# Patient Record
Sex: Male | Born: 1980 | Race: Black or African American | Hispanic: No | Marital: Single | Smoking: Never smoker
Health system: Southern US, Community
[De-identification: ages and names within clinical notes are randomized; demographics above are authoritative.]

## PROBLEM LIST (undated history)

## (undated) HISTORY — PX: WISDOM TOOTH EXTRACTION: SHX21

---

## 2015-01-21 ENCOUNTER — Ambulatory Visit (INDEPENDENT_AMBULATORY_CARE_PROVIDER_SITE_OTHER): Payer: PRIVATE HEALTH INSURANCE | Admitting: Family Medicine

## 2015-01-21 VITALS — BP 128/78 | HR 86 | Temp 98.3°F | Resp 16 | Ht 69.75 in | Wt 197.0 lb

## 2015-01-21 DIAGNOSIS — R300 Dysuria: Secondary | ICD-10-CM | POA: Diagnosis not present

## 2015-01-21 LAB — POCT URINALYSIS DIPSTICK
Bilirubin, UA: NEGATIVE
Blood, UA: NEGATIVE
Glucose, UA: NEGATIVE
Ketones, UA: NEGATIVE
Leukocytes, UA: NEGATIVE
Nitrite, UA: NEGATIVE
Protein, UA: NEGATIVE
Spec Grav, UA: 1.025
Urobilinogen, UA: 0.2
pH, UA: 5.5

## 2015-01-21 LAB — POCT UA - MICROSCOPIC ONLY
Bacteria, U Microscopic: NEGATIVE
Casts, Ur, LPF, POC: NEGATIVE
Crystals, Ur, HPF, POC: NEGATIVE
Mucus, UA: NEGATIVE
RBC, urine, microscopic: NEGATIVE
WBC, Ur, HPF, POC: NEGATIVE
Yeast, UA: NEGATIVE

## 2015-01-21 MED ORDER — DOXYCYCLINE HYCLATE 100 MG PO TABS: 100.0000 mg | ORAL_TABLET | Freq: Two times a day (BID) | ORAL | Status: DC

## 2015-01-21 NOTE — Patient Instructions (Signed)
This may be a ureaplasma "infection" or just a simple irritation.  Lab tests are pending.

## 2015-01-21 NOTE — Progress Notes (Addendum)
Subjective:  This chart was scribed for Randy SidleKurt Lauenstein, MD by Broadus Johnawaa Al Rifaie, Medical Scribe. This patient was seen in Room 10  and the patient's care was started at 10:19 AM.   Patient ID: Randy Curry, male    DOB: 04/21/1981, 34 y.o.   MRN: [id number]  HPI HPI Comments: Randy NatalMichael S Curry is a 34 y.o. male who presents to [facility] complaining of irritation after urination, gradual onset about 4 days ago. Pt notes that he has been in the pool recently, however he does not recall any other major events. Pt denies any abnormal discharges, nausea, vomiting, fever, or abdominal pain. Pt reports no new sexual partners other than his current partner.   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    Review of Systems  Constitutional: Negative for fever.  Gastrointestinal: Negative for nausea, vomiting and abdominal pain.  Genitourinary: Positive for difficulty urinating (irritation after urination ). Negative for discharge.      Objective:   Physical Exam  Constitutional: He is oriented to person, place, and time. He appears well-developed and well-nourished. No distress.  HENT:  Head: Normocephalic and atraumatic.  Eyes: EOM are normal. Pupils are equal, round, and reactive to light.  Neck: Neck supple.  Cardiovascular: Normal rate.   Pulmonary/Chest: Effort normal.  Abdominal: Hernia confirmed negative in the right inguinal area and confirmed negative in the left inguinal area.  Genitourinary: Circumcised.  Small Skin tag on the side of penis about 1 mm  1 cm cyst-like structure above the testicle. Both testacies are normal. No unusual skin lesions.     Neurological: He is alert and oriented to person, place, and time. No cranial nerve deficit.  Skin: Skin is warm and dry.  Psychiatric: He has a normal mood and affect. His behavior is normal.  Nursing note and vitals reviewed.  Results for orders placed or performed in visit on 01/21/15  POCT urinalysis  dipstick  Result Value Ref Range   Color, UA yellow    Clarity, UA clear    Glucose, UA neg    Bilirubin, UA neg    Ketones, UA neg    Spec Grav, UA 1.025    Blood, UA neg    pH, UA 5.5    Protein, UA neg    Urobilinogen, UA 0.2    Nitrite, UA neg    Leukocytes, UA Negative Negative  POCT UA - Microscopic Only  Result Value Ref Range   WBC, Ur, HPF, POC neg    RBC, urine, microscopic neg    Bacteria, U Microscopic neg    Mucus, UA neg    Epithelial cells, urine per micros 0-1    Crystals, Ur, HPF, POC neg    Casts, Ur, LPF, POC neg    Yeast, UA neg        Assessment & Plan:   This chart was scribed in my presence and reviewed by me personally.    ICD-9-CM ICD-10-CM   1. Dysuria 788.1 R30.0 POCT urinalysis dipstick     POCT UA - Microscopic Only     Urine culture     GC/Chlamydia Probe Amp     doxycycline (VIBRA-TABS) 100 MG tablet   This seems to be a very minor problem and there is no obvious cause detected. We discussed the fact that it could be simple irritation after intercourse, irritation after bathing last week, Ureaplasma. Sometimes is best just to wait and see if it goes away  on its own, but I wrote a prescription for doxycycline in case it persists. Lab tests are pending.  Signed, Randy Sidle, MD

## 2015-01-22 LAB — GC/CHLAMYDIA PROBE AMP
CT Probe RNA: NEGATIVE
GC Probe RNA: NEGATIVE

## 2015-01-22 LAB — URINE CULTURE
Colony Count: NO GROWTH
Organism ID, Bacteria: NO GROWTH

## 2015-05-09 ENCOUNTER — Ambulatory Visit
Admission: RE | Admit: 2015-05-09 | Discharge: 2015-05-09 | Disposition: A | Discharge status: Home / Self Care | Payer: Self-pay | Source: Ambulatory Visit | Attending: Emergency Medicine | Admitting: Emergency Medicine

## 2015-05-09 ENCOUNTER — Ambulatory Visit
Admit: 2015-05-09 | Discharge: 2015-05-09 | Disposition: A | Discharge status: Home / Self Care | Payer: Self-pay | Attending: Emergency Medicine | Admitting: Emergency Medicine

## 2015-05-09 DIAGNOSIS — M79671 Pain in right foot: Secondary | ICD-10-CM | POA: Insufficient documentation

## 2016-12-18 IMAGING — CR DG FOOT COMPLETE 3+V*R*
1 series · 3 of 3 positions shown · non-contrast
Comparison: None.

CLINICAL DATA: Nontraumatic plantar pain

EXAM:
RIGHT FOOT COMPLETE - 3+ VIEW

[Series 1: dg foot complete right · 0.14mm/px · 3 of 3 slices shown]
[im 1/3]
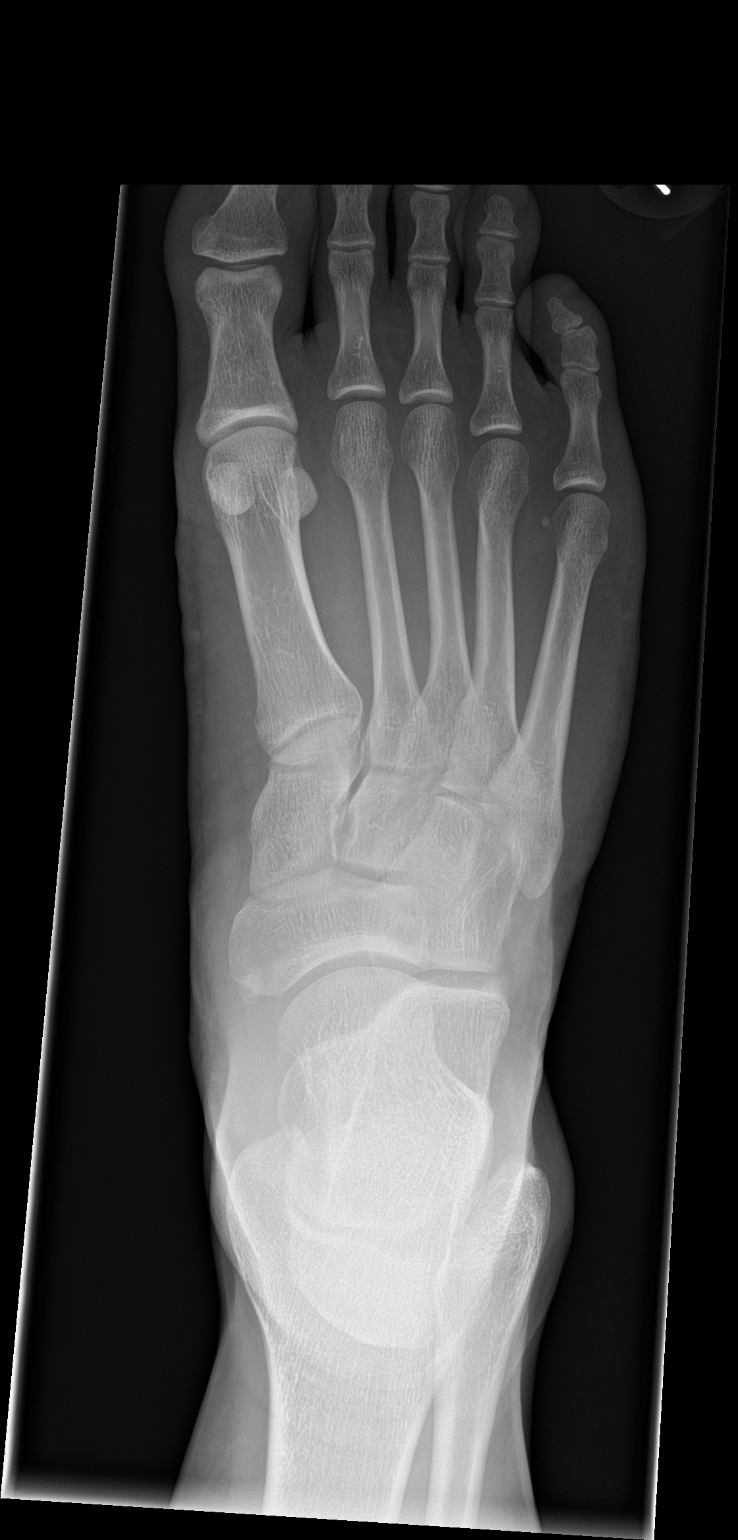
[im 2/3]
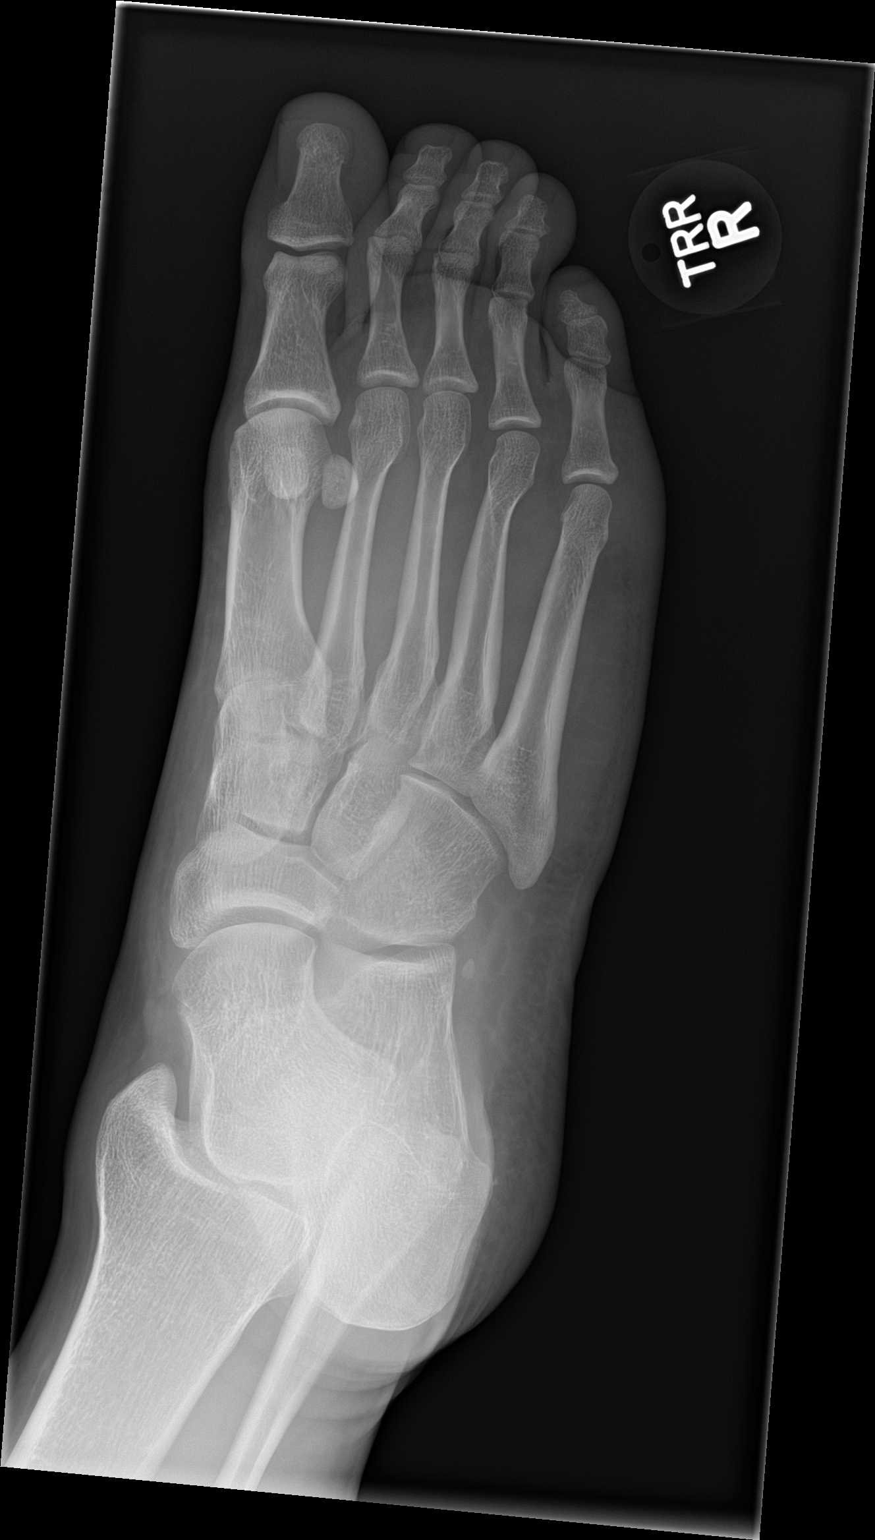
[im 3/3]
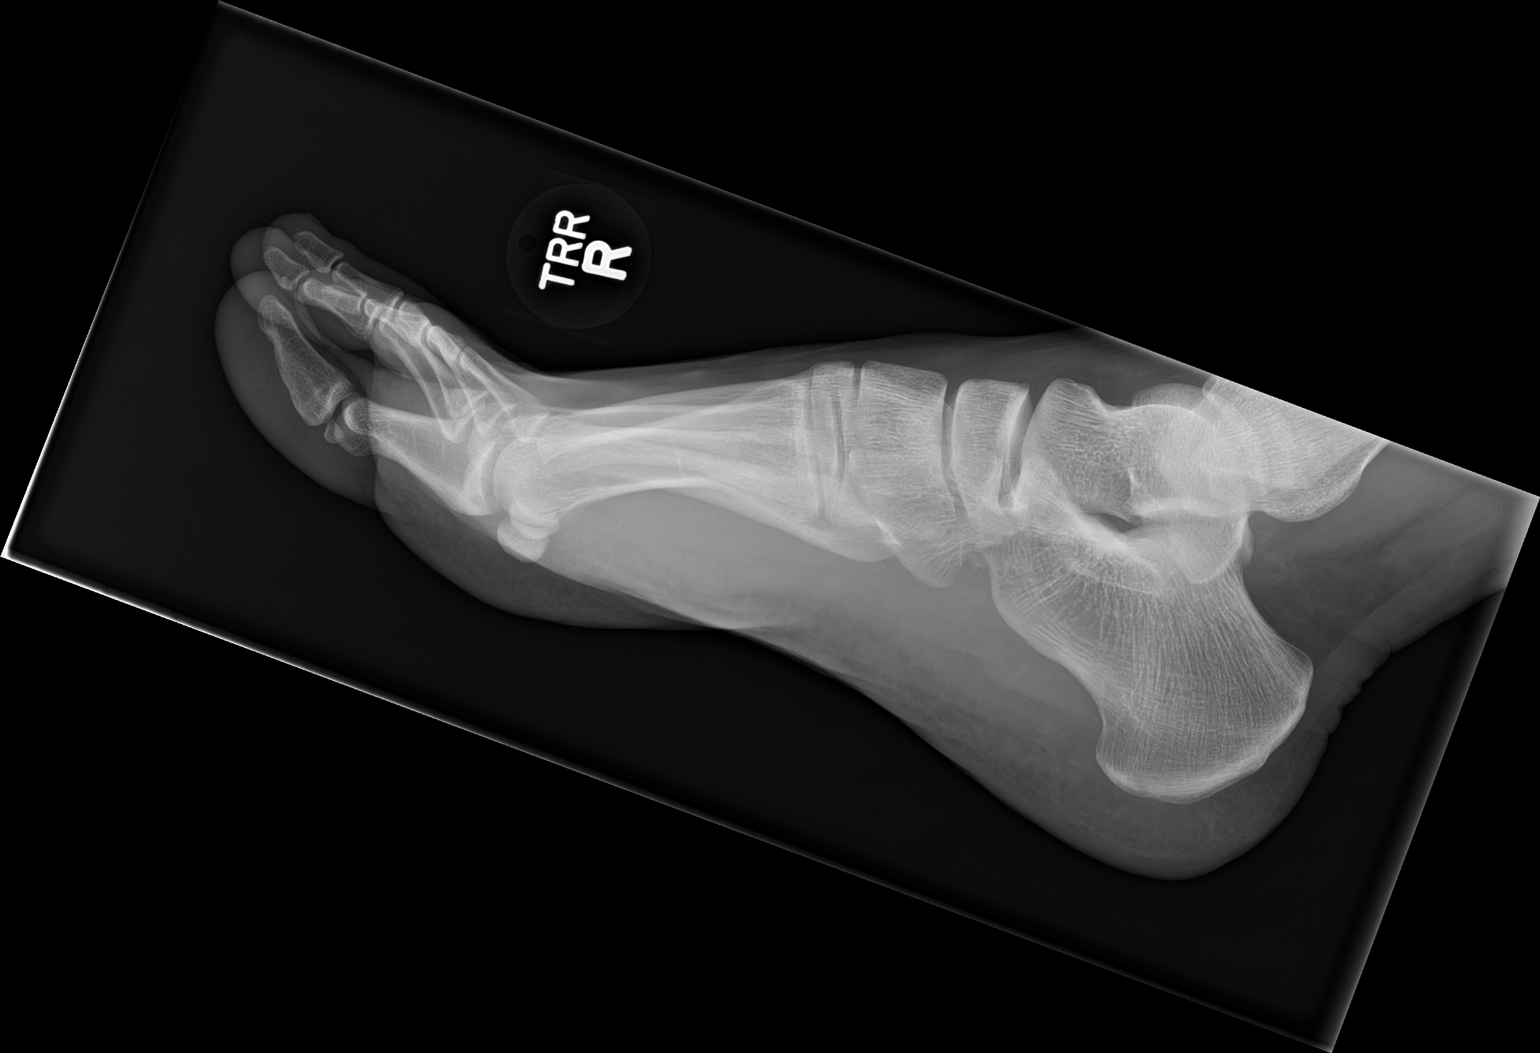

[3 of 3 positions shown; findings below may reference images not displayed]

FINDINGS: There is no evidence of fracture or dislocation. There is no
evidence of arthropathy or other focal bone abnormality. Soft
tissues are unremarkable.
IMPRESSION: Negative.

## 2017-11-01 ENCOUNTER — Ambulatory Visit (INDEPENDENT_AMBULATORY_CARE_PROVIDER_SITE_OTHER): Payer: 59

## 2017-11-01 ENCOUNTER — Encounter (HOSPITAL_COMMUNITY): Payer: Self-pay | Admitting: Emergency Medicine

## 2017-11-01 ENCOUNTER — Ambulatory Visit (HOSPITAL_COMMUNITY)
Admission: EM | Admit: 2017-11-01 | Discharge: 2017-11-01 | Disposition: A | Discharge status: Home / Self Care | Payer: 59 | Attending: Family Medicine | Admitting: Family Medicine

## 2017-11-01 DIAGNOSIS — M79671 Pain in right foot: Secondary | ICD-10-CM

## 2017-11-01 NOTE — Discharge Instructions (Addendum)
Shoe support Ice Antiinflammatory meds Reduce activity and gradually build back up See foot and ankle if fails to improve

## 2017-11-01 NOTE — ED Provider Notes (Signed)
[facility]    CSN: [id number] Arrival date & time: 11/01/17  1206     History   Chief Complaint Chief Complaint  Patient presents with  . Foot Pain    HPI Randy Curry is a 37 y.o. male.   HPI Patient has a right foot injury.  No trauma.  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  He runs on a treadmill and on incline.  He has had a gradual onset of foot pain over the last month.  It is not getting better.  It is swollen.  He is never had similar problems before.  He did twist his foot some years ago, but it recovered completely with no problems. History reviewed. No pertinent past medical history.  There are no active problems to display for this patient.   Past Surgical History:  Procedure Laterality Date  . WISDOM TOOTH EXTRACTION         Home Medications    Prior to Admission medications   Medication Sig Start Date End Date Taking? Authorizing Provider  doxycycline (VIBRA-TABS) 100 MG tablet Take 1 tablet (100 mg total) by mouth 2 (two) times daily. Patient not taking: Reported on 11/01/2017 01/21/15   [name], MD    Family History Family History  Problem Relation Age of Onset  . Hypertension Father     Social History Social History   Tobacco Use  . Smoking status: Never Smoker  Substance Use Topics  . Alcohol use: Not on file  . Drug use: Not on file     Allergies   Penicillins and Sulfa antibiotics   Review of Systems Review of Systems  Musculoskeletal: Positive for arthralgias and gait problem.  All other systems reviewed and are negative.  Patient states otherwise is healthy and well, on no medications.  No history of gout  Physical Exam Triage Vital Signs ED Triage Vitals [11/01/17 1219]  Enc Vitals Group     BP 123/75     Pulse Rate 66     Resp 18     Temp 98.1 F (36.7 C)     Temp src      SpO2 98 %     Weight      Height      Head  Circumference      Peak Flow      Pain Score      Pain Loc      Pain Edu?      Excl. in GC?    No data found.  Updated Vital Signs BP 123/75   Pulse 66   Temp 98.1 F (36.7 C)   Resp 18   SpO2 98%       Physical Exam  Constitutional: He appears well-developed and well-nourished. No distress.  HENT:  Head: Normocephalic and atraumatic.  Mouth/Throat: Oropharynx is clear and moist.  Musculoskeletal:  Exam limited to feet.  Left foot is normal.  Right foot appears normal, moderate soft tissue swelling between the first and second metatarsals distally.  Tenderness palpation in this area.  No tenderness over the metatarsal bones.  No tenderness over the MTP joints or pain with movement.  Bottom of the foot appears normal.  No erythema or warmth.  X-rays are negative     UC Treatments / Results  Labs (all labs ordered are listed, but only abnormal results are displayed) Labs Reviewed -  No data to display  EKG None  Radiology Dg Foot Complete Right  Result Date: 11/01/2017 CLINICAL DATA:  Right foot pain.  No known injury. EXAM: RIGHT FOOT COMPLETE - 3+ VIEW COMPARISON:  05/09/2015. FINDINGS: No acute bony or joint abnormality. No evidence of fracture dislocation. No radiopaque soft tissue foreign bodies noted. IMPRESSION: No acute or focal abnormality. Electronically Signed   By: Maisie Fus  Register   On: 11/01/2017 12:33    Procedures Procedures (including critical care time)  Medications Ordered in UC Medications - No data to display  Initial Impression / Assessment and Plan / UC Course  I have reviewed the triage vital signs and the nursing notes.  Pertinent labs & imaging results that were available during my care of the patient were reviewed by me and considered in my medical decision making (see chart for details).     Foot pain, overuse from athletics.  Soft tissue injury.  No evidence of fracture. Final Clinical Impressions(s) / UC Diagnoses   Final  diagnoses:  Foot pain, right     Discharge Instructions     Shoe support Ice Antiinflammatory meds Reduce activity and gradually build back up See foot and ankle if fails to improve    ED Prescriptions    None     Controlled Substance Prescriptions [place] Controlled Substance Registry consulted? Not Applicable   Eustace Moore, MD 11/01/17 [phone]

## 2017-11-01 NOTE — ED Triage Notes (Signed)
R foot pain x4 weeks.

## 2017-11-14 ENCOUNTER — Other Ambulatory Visit: Payer: Self-pay | Admitting: Physician Assistant

## 2017-11-14 DIAGNOSIS — R197 Diarrhea, unspecified: Secondary | ICD-10-CM

## 2017-11-14 MED ORDER — CIPROFLOXACIN HCL 500 MG PO TABS: 500.0000 mg | ORAL_TABLET | Freq: Two times a day (BID) | ORAL | 0 refills | Status: DC

## 2017-11-21 DIAGNOSIS — M79671 Pain in right foot: Secondary | ICD-10-CM | POA: Diagnosis not present

## 2017-12-20 DIAGNOSIS — M79671 Pain in right foot: Secondary | ICD-10-CM | POA: Diagnosis not present

## 2018-03-20 ENCOUNTER — Ambulatory Visit
Admission: EM | Admit: 2018-03-20 | Discharge: 2018-03-20 | Disposition: A | Discharge status: Home / Self Care | Payer: 59 | Attending: Family Medicine | Admitting: Family Medicine

## 2018-03-20 DIAGNOSIS — R1013 Epigastric pain: Secondary | ICD-10-CM | POA: Diagnosis not present

## 2018-03-20 DIAGNOSIS — A084 Viral intestinal infection, unspecified: Secondary | ICD-10-CM

## 2018-03-20 LAB — COMPREHENSIVE METABOLIC PANEL
ALT: 18 U/L (ref 0–44)
AST: 27 U/L (ref 15–41)
Albumin: 4.2 g/dL (ref 3.5–5.0)
Alkaline Phosphatase: 46 U/L (ref 38–126)
Anion gap: 11 (ref 5–15)
BILIRUBIN TOTAL: 0.6 mg/dL (ref 0.3–1.2)
BUN: 10 mg/dL (ref 6–20)
CHLORIDE: 103 mmol/L (ref 98–111)
CO2: 27 mmol/L (ref 22–32)
CREATININE: 1.24 mg/dL (ref 0.61–1.24)
Calcium: 9 mg/dL (ref 8.9–10.3)
Glucose, Bld: 92 mg/dL (ref 70–99)
POTASSIUM: 3.6 mmol/L (ref 3.5–5.1)
Sodium: 141 mmol/L (ref 135–145)
TOTAL PROTEIN: 7.9 g/dL (ref 6.5–8.1)

## 2018-03-20 LAB — CBC WITH DIFFERENTIAL/PLATELET
Basophils Absolute: 0 10*3/uL (ref 0–0.1)
Basophils Relative: 0 %
EOS PCT: 0 %
Eosinophils Absolute: 0 10*3/uL (ref 0–0.7)
HEMATOCRIT: 46 % (ref 40.0–52.0)
Hemoglobin: 15.1 g/dL (ref 13.0–18.0)
LYMPHS ABS: 0.7 10*3/uL — AB (ref 1.0–3.6)
LYMPHS PCT: 15 %
MCH: 28.3 pg (ref 26.0–34.0)
MCHC: 32.8 g/dL (ref 32.0–36.0)
MCV: 86.3 fL (ref 80.0–100.0)
MONO ABS: 0.8 10*3/uL (ref 0.2–1.0)
MONOS PCT: 16 %
NEUTROS ABS: 3.3 10*3/uL (ref 1.4–6.5)
Neutrophils Relative %: 69 %
PLATELETS: 173 10*3/uL (ref 150–440)
RBC: 5.33 MIL/uL (ref 4.40–5.90)
RDW: 13.6 % (ref 11.5–14.5)
WBC: 4.9 10*3/uL (ref 3.8–10.6)

## 2018-03-20 LAB — LIPASE, BLOOD: LIPASE: 36 U/L (ref 11–51)

## 2018-03-20 MED ORDER — SODIUM CHLORIDE 0.9 % IV BOLUS
1000.0000 mL | Freq: Once | INTRAVENOUS | Status: AC
  Administered 2018-03-20: 1000 mL via INTRAVENOUS

## 2018-03-20 MED ORDER — GI COCKTAIL ~~LOC~~
30.0000 mL | Freq: Once | ORAL | Status: AC
  Administered 2018-03-20: 30 mL via ORAL

## 2018-03-20 MED ORDER — ONDANSETRON 8 MG PO TBDP
8.0000 mg | ORAL_TABLET | Freq: Once | ORAL | Status: AC
  Administered 2018-03-20: 8 mg via ORAL

## 2018-03-20 NOTE — ED Provider Notes (Signed)
[facility]    CSN: [id number] Arrival date & time: 03/20/18  1451     History   Chief Complaint Chief Complaint  Patient presents with  . Abdominal Pain    HPI Randy Curry is a 37 y.o. male.   37 yo male with a c/o epigastric abdominal pains and diarrhea for the past 4 days. States he took some Pepto-Bismol and diarrhea has improved/subsided, but epigastric abdominal pain today is worse. Has had nausea but no vomiting.   The history is provided by the patient.    History reviewed. No pertinent past medical history.  There are no active problems to display for this patient.   Past Surgical History:  Procedure Laterality Date  . WISDOM TOOTH EXTRACTION         Home Medications    Prior to Admission medications   Medication Sig Start Date End Date Taking? Authorizing Provider  ciprofloxacin (CIPRO) 500 MG tablet Take 1 tablet (500 mg total) by mouth 2 (two) times daily. 11/14/17   McVey, Madelaine BhatElizabeth Whitney, PA-C    Family History Family History  Problem Relation Age of Onset  . Hypertension Father     Social History Social History   Tobacco Use  . Smoking status: Never Smoker  . Smokeless tobacco: Never Used  Substance Use Topics  . Alcohol use: Yes    Alcohol/week: 0.0 standard drinks  . Drug use: Never     Allergies   Penicillins and Sulfa antibiotics   Review of Systems Review of Systems   Physical Exam Triage Vital Signs ED Triage Vitals  Enc Vitals Group     BP 03/20/18 1500 124/87     Pulse Rate 03/20/18 1500 94     Resp 03/20/18 1500 16     Temp 03/20/18 1500 99.9 F (37.7 C)     Temp Source 03/20/18 1500 Oral     SpO2 03/20/18 1500 100 %     Weight 03/20/18 1459 212 lb (96.2 kg)     Height 03/20/18 1459 5\' 11"  (1.803 m)     Head Circumference --      Peak Flow --      Pain Score 03/20/18 1458 6     Pain Loc --      Pain Edu? --      Excl. in GC? --    No data found.  Updated Vital Signs BP 124/87 (BP  Location: Left Arm)   Pulse 94   Temp 99.9 F (37.7 C) (Oral)   Resp 16   Ht 5\' 11"  (1.803 m)   Wt 96.2 kg   SpO2 100%   BMI 29.57 kg/m   Visual Acuity Right Eye Distance:   Left Eye Distance:   Bilateral Distance:    Right Eye Near:   Left Eye Near:    Bilateral Near:     Physical Exam  Constitutional: He is oriented to person, place, and time. He appears well-developed and well-nourished. No distress.  HENT:  Head: Normocephalic and atraumatic.  Cardiovascular: Normal rate, regular rhythm, normal heart sounds and intact distal pulses.  No murmur heard. Pulmonary/Chest: Effort normal and breath sounds normal. No respiratory distress. He has no wheezes. He has no rales.  Abdominal: Soft. Bowel sounds are normal. He exhibits no distension and no mass. There is tenderness (epigastric; moderate; no rebound or guarding). There is no rebound and no guarding.  Neurological: He is alert and oriented to person, place, and time.  Skin: No rash  noted. He is not diaphoretic.  Nursing note and vitals reviewed.    UC Treatments / Results  Labs (all labs ordered are listed, but only abnormal results are displayed) Labs Reviewed  CBC WITH DIFFERENTIAL/PLATELET - Abnormal; Notable for the following components:      Result Value   Lymphs Abs 0.7 (*)    All other components within normal limits  COMPREHENSIVE METABOLIC PANEL  LIPASE, BLOOD    EKG None  Radiology No results found.  Procedures Procedures (including critical care time)  Medications Ordered in UC Medications  sodium chloride 0.9 % bolus 1,000 mL (0 mLs Intravenous Stopped 03/20/18 1728)  ondansetron (ZOFRAN-ODT) disintegrating tablet 8 mg (8 mg Oral Given 03/20/18 1539)  gi cocktail (Maalox,Lidocaine,Donnatal) (30 mLs Oral Given 03/20/18 1614)    Initial Impression / Assessment and Plan / UC Course  I have reviewed the triage vital signs and the nursing notes.  Pertinent labs & imaging results that were  available during my care of the patient were reviewed by me and considered in my medical decision making (see chart for details).      Final Clinical Impressions(s) / UC Diagnoses   Final diagnoses:  Viral gastroenteritis   Discharge Instructions   None    ED Prescriptions    None      1. Lab results and diagnosis reviewed with patient 2. Given zofran 8mg  odt and GI cocktail with improvement of symptoms 3. Given 1L NS 3. Recommend supportive treatment with increased po fluids, otc meds prn  4. Follow-up prn if symptoms worsen or don't improve  Controlled Substance Prescriptions [place] Controlled Substance Registry consulted? Not Applicable   Payton Mccallum, MD 03/20/18 1929

## 2018-03-20 NOTE — ED Triage Notes (Addendum)
As per patient has abdominal pain right in middle area also had diarrhea and fever onset 2 days.

## 2018-04-10 ENCOUNTER — Ambulatory Visit (HOSPITAL_COMMUNITY)
Admission: EM | Admit: 2018-04-10 | Discharge: 2018-04-10 | Disposition: A | Discharge status: Home / Self Care | Payer: 59 | Attending: Family Medicine | Admitting: Family Medicine

## 2018-04-10 ENCOUNTER — Encounter (HOSPITAL_COMMUNITY): Payer: Self-pay | Admitting: Emergency Medicine

## 2018-04-10 DIAGNOSIS — R1013 Epigastric pain: Secondary | ICD-10-CM

## 2018-04-10 DIAGNOSIS — R197 Diarrhea, unspecified: Secondary | ICD-10-CM | POA: Insufficient documentation

## 2018-04-10 DIAGNOSIS — Z880 Allergy status to penicillin: Secondary | ICD-10-CM | POA: Diagnosis not present

## 2018-04-10 DIAGNOSIS — Z882 Allergy status to sulfonamides status: Secondary | ICD-10-CM | POA: Diagnosis not present

## 2018-04-10 LAB — POCT URINALYSIS DIP (DEVICE)
GLUCOSE, UA: NEGATIVE mg/dL
Hgb urine dipstick: NEGATIVE
Ketones, ur: NEGATIVE mg/dL
Leukocytes, UA: NEGATIVE
NITRITE: NEGATIVE
PH: 5.5 (ref 5.0–8.0)
PROTEIN: 30 mg/dL — AB
Specific Gravity, Urine: >=1.03 (ref 1.005–1.030)
Urobilinogen, UA: 1 mg/dL (ref 0.0–1.0)

## 2018-04-10 LAB — POCT I-STAT, CHEM 8
BUN: 11 mg/dL (ref 6–20)
Calcium, Ion: 1.12 mmol/L — ABNORMAL LOW (ref 1.15–1.40)
Chloride: 100 mmol/L (ref 98–111)
Creatinine, Ser: 1.2 mg/dL (ref 0.61–1.24)
GLUCOSE: 83 mg/dL (ref 70–99)
HEMATOCRIT: 47 % (ref 39.0–52.0)
HEMOGLOBIN: 16 g/dL (ref 13.0–17.0)
POTASSIUM: 3.9 mmol/L (ref 3.5–5.1)
Sodium: 138 mmol/L (ref 135–145)
TCO2: 29 mmol/L (ref 22–32)

## 2018-04-10 LAB — CBC WITH DIFFERENTIAL/PLATELET
Basophils Absolute: 0 10*3/uL (ref 0.0–0.1)
Basophils Relative: 0 %
Eosinophils Absolute: 0.1 10*3/uL (ref 0.0–0.7)
Eosinophils Relative: 2 %
HCT: 45.6 % (ref 39.0–52.0)
Hemoglobin: 14.5 g/dL (ref 13.0–17.0)
Lymphocytes Relative: 70 %
Lymphs Abs: 2.4 10*3/uL (ref 0.7–4.0)
MCH: 27.7 pg (ref 26.0–34.0)
MCHC: 31.8 g/dL (ref 30.0–36.0)
MCV: 87.2 fL (ref 78.0–100.0)
Monocytes Absolute: 0.5 10*3/uL (ref 0.1–1.0)
Monocytes Relative: 15 %
Neutro Abs: 0.4 10*3/uL — ABNORMAL LOW (ref 1.7–7.7)
Neutrophils Relative %: 13 %
Platelets: 203 10*3/uL (ref 150–400)
RBC: 5.23 MIL/uL (ref 4.22–5.81)
RDW: 12.6 % (ref 11.5–15.5)
WBC: 3.4 10*3/uL — ABNORMAL LOW (ref 4.0–10.5)

## 2018-04-10 NOTE — ED Notes (Signed)
Sent home with stool collection kit

## 2018-04-10 NOTE — Discharge Instructions (Signed)
At this point I think the most prudent thing is to sit tight and wait for the lab test to come back.

## 2018-04-10 NOTE — ED Provider Notes (Signed)
[facility]    CSN: [id number] Arrival date & time: 04/10/18  1806     History   Chief Complaint Chief Complaint  Patient presents with  . Diarrhea    HPI BYRANT VALENT is a 37 y.o. male.    This is a 37 year old composition who comes in with 1 month of diarrhea.  The onset was insidious and patient had several weeks of nausea, vomiting, and loose stools up to 6 a day.  He started feeling better about 2 weeks ago but then he went to [place] and started feeling worse again.  He last vomited 2 nights ago.  Patient has had some episodes where he felt feverish but then when the temperature was taken, he had a normal temperature.  Patient notes some increase in belching but the odor has not been particularly malodorous.   Note from the ED on 03/20/2018: 37 yo male with a c/o epigastric abdominal pains and diarrhea for the past 4 days. States he took some Pepto-Bismol and diarrhea has improved/subsided, but epigastric abdominal pain today is worse. Has had nausea but no vomiting.   The history is provided by the patient.      There are no active problems to display for this patient.     History reviewed. No pertinent past medical history.  There are no active problems to display for this patient.   Past Surgical History:  Procedure Laterality Date  . WISDOM TOOTH EXTRACTION         Home Medications    Prior to Admission medications   Not on File    Family History Family History  Problem Relation Age of Onset  . Hypertension Father     Social History Social History   Tobacco Use  . Smoking status: Never Smoker  . Smokeless tobacco: Never Used  Substance Use Topics  . Alcohol use: Yes    Alcohol/week: 0.0 standard drinks  . Drug use: Never     Allergies   Penicillins and Sulfa antibiotics   Review of Systems Review of Systems  Constitutional: Positive for appetite change and fatigue. Negative for fever.  HENT: Negative.     Gastrointestinal: Positive for diarrhea, nausea and vomiting. Negative for blood in stool.  Neurological: Positive for weakness.  All other systems reviewed and are negative.    Physical Exam Triage Vital Signs ED Triage Vitals [04/10/18 1817]  Enc Vitals Group     BP      Pulse      Resp      Temp      Temp src      SpO2      Weight      Height      Head Circumference      Peak Flow      Pain Score 4     Pain Loc      Pain Edu?      Excl. in GC?    No data found.  Updated Vital Signs BP 129/78   Pulse 86   Temp 98.8 F (37.1 C) (Oral)   Resp 16   SpO2 99%    Physical Exam  Constitutional: He is oriented to person, place, and time. He appears well-developed and well-nourished.  HENT:  Right Ear: External ear normal.  Left Ear: External ear normal.  Mouth/Throat: Oropharynx is clear and moist.  Eyes: Pupils are equal, round, and reactive to light. Conjunctivae are normal.  Neck: Normal range of motion. Neck supple.  Cardiovascular: Normal rate, regular rhythm and normal heart sounds.  Pulmonary/Chest: Effort normal and breath sounds normal.  Abdominal: Soft. Bowel sounds are normal. He exhibits no distension and no mass. There is no tenderness. There is no guarding.  Musculoskeletal: Normal range of motion.  Neurological: He is alert and oriented to person, place, and time.  Skin: Skin is warm and dry.  Nursing note and vitals reviewed.    UC Treatments / Results  Labs (all labs ordered are listed, but only abnormal results are displayed) Labs Reviewed  POCT I-STAT, CHEM 8 - Abnormal; Notable for the following components:      Result Value   Calcium, Ion 1.12 (*)    All other components within normal limits  GASTROINTESTINAL PANEL BY PCR, STOOL (REPLACES STOOL CULTURE)  CBC WITH DIFFERENTIAL/PLATELET    EKG None  Radiology No results found.  Procedures Procedures (including critical care time)  Medications Ordered in UC Medications - No  data to display  Initial Impression / Assessment and Plan / UC Course  I have reviewed the triage vital signs and the nursing notes.  Pertinent labs & imaging results that were available during my care of the patient were reviewed by me and considered in my medical decision making (see chart for details).     Final Clinical Impressions(s) / UC Diagnoses   Final diagnoses:  Diarrhea, unspecified type  Diarrhea in adult patient     Discharge Instructions     At this point I think the most prudent thing is to sit tight and wait for the lab test to come back.    ED Prescriptions    None     Controlled Substance Prescriptions [place] Controlled Substance Registry consulted? Not Applicable   [name], MD 04/10/18 Nicholos Johns

## 2018-04-10 NOTE — ED Triage Notes (Signed)
PT reports nausea, vomiting, diarrhea for a month. Diarrhea has progressed to watery stool. PT recently vacationed in [place]. He was having some of these symptoms before he left, but they have worsened since the trip. PT has lost weight, has a subjective fever, sweats on and off. PT has had fluids twice since onset, but has not had any stool testing.

## 2018-04-11 LAB — PATHOLOGIST SMEAR REVIEW

## 2018-04-13 ENCOUNTER — Telehealth (HOSPITAL_COMMUNITY): Payer: Self-pay

## 2018-04-13 NOTE — Telephone Encounter (Signed)
Pt called and notified of test results, no change in plan of are per Dr. Milus Glazier. Pt states he will not be bringing in stool sample due to feeling better.
